# Patient Record
Sex: Male | Born: 1995 | Race: Black or African American | Hispanic: No | Marital: Single | Smoking: Current every day smoker
Health system: Southern US, Community
[De-identification: ages and names within clinical notes are randomized; demographics above are authoritative.]

---

## 2015-06-14 ENCOUNTER — Encounter (HOSPITAL_COMMUNITY): Payer: Self-pay | Admitting: Nurse Practitioner

## 2015-06-14 ENCOUNTER — Emergency Department (HOSPITAL_COMMUNITY): Payer: Self-pay

## 2015-06-14 ENCOUNTER — Emergency Department (HOSPITAL_COMMUNITY)
Admission: EM | Admit: 2015-06-14 | Discharge: 2015-06-14 | Disposition: A | Discharge status: Home / Self Care | Payer: Self-pay | Attending: Emergency Medicine | Admitting: Emergency Medicine

## 2015-06-14 DIAGNOSIS — F17210 Nicotine dependence, cigarettes, uncomplicated: Secondary | ICD-10-CM | POA: Insufficient documentation

## 2015-06-14 DIAGNOSIS — Y9289 Other specified places as the place of occurrence of the external cause: Secondary | ICD-10-CM | POA: Insufficient documentation

## 2015-06-14 DIAGNOSIS — Y998 Other external cause status: Secondary | ICD-10-CM | POA: Insufficient documentation

## 2015-06-14 DIAGNOSIS — W2209XA Striking against other stationary object, initial encounter: Secondary | ICD-10-CM | POA: Insufficient documentation

## 2015-06-14 DIAGNOSIS — Y9389 Activity, other specified: Secondary | ICD-10-CM | POA: Insufficient documentation

## 2015-06-14 DIAGNOSIS — S6991XA Unspecified injury of right wrist, hand and finger(s), initial encounter: Secondary | ICD-10-CM | POA: Insufficient documentation

## 2015-06-14 MED ORDER — IBUPROFEN 600 MG PO TABS: 600.0000 mg | ORAL_TABLET | Freq: Four times a day (QID) | ORAL | Status: AC | PRN

## 2015-06-14 MED ORDER — IBUPROFEN 400 MG PO TABS
800.0000 mg | ORAL_TABLET | Freq: Once | ORAL | Status: AC
  Administered 2015-06-14: 800 mg via ORAL
  Filled 2015-06-14: qty 2

## 2015-06-14 NOTE — ED Provider Notes (Signed)
CSN: [id number]     Arrival date & time 06/14/15  1327 History  By signing my name below, I, Soijett Blue, attest that this documentation has been prepared under the direction and in the presence of S. Lane Hacker, PA-C Electronically Signed: Soijett Blue, ED Scribe. 06/14/2015. 2:49 PM.    Chief Complaint  Patient presents with  . Hand Injury      The history is provided by the patient. No language interpreter was used.    Gabriel Barrett is a 20 y.o. male who presents to the Emergency Department complaining of right hand injury occurring last night. Pt states that he struck a wall due to being upset. Pt is having associated symptoms of right hand swelling and tingling to right knuckles. He notes that he has not tried any medications for the relief of his symptoms. He denies numbness and any other symptoms.    History reviewed. No pertinent past medical history. History reviewed. No pertinent past surgical history. History reviewed. No pertinent family history. Social History  Substance Use Topics  . Smoking status: Current Every Day Smoker    Types: Cigarettes  . Smokeless tobacco: None  . Alcohol Use: Yes    Review of Systems  A complete 10 system review of systems was obtained and all systems are negative except as noted in the HPI and PMH.   Allergies  Review of patient's allergies indicates no known allergies.  Home Medications   Prior to Admission medications   Not on File   BP 120/81 mmHg  Pulse 75  Temp(Src) 98.5 F (36.9 C) (Oral)  Resp 17  SpO2 99% Physical Exam  Constitutional: He is oriented to person, place, and time. He appears well-developed and well-nourished. No distress.  HENT:  Head: Normocephalic and atraumatic.  Eyes: EOM are normal.  Neck: Neck supple.  Cardiovascular: Normal rate.   Pulmonary/Chest: Effort normal. No respiratory distress.  Abdominal: He exhibits no distension.  Musculoskeletal: Normal range of motion.       Right  hand: He exhibits swelling. He exhibits normal range of motion. Normal sensation noted. Normal strength noted.  Edematous right hand. TTP at right fourth and fifth digit. NVI. FROM.  Neurological: He is alert and oriented to person, place, and time.  Skin: Skin is warm and dry.  Psychiatric: He has a normal mood and affect. His behavior is normal.  Nursing note and vitals reviewed.   ED Course  Procedures  DIAGNOSTIC STUDIES: Oxygen Saturation is 99% on RA, nl by my interpretation.    COORDINATION OF CARE: 2:48 PM Discussed treatment plan with pt at bedside which includes anti-inflammatory Rx, follow up with PCP in 1 week and pt agreed to plan.  Imaging Review Dg Hand Complete Right  06/14/2015  CLINICAL DATA:  Pain and swelling at right fourth and fifth digits on posterior side of metacarpals after punching a brick wall yesterday. EXAM: RIGHT HAND - COMPLETE 3+ VIEW COMPARISON:  None. FINDINGS: Osseous alignment is normal. Bone mineralization is normal. No fracture line or displaced fracture fragment seen. Soft tissues about the right hand are unremarkable. IMPRESSION: Negative. Electronically Signed   By: Bary Richard M.D.   On: 06/14/2015 14:10   I have personally reviewed and evaluated these images as part of my medical decision-making.  MDM   Final diagnoses:  Hand injury, right, initial encounter   Patient X-Ray negative for obvious fracture or dislocation.  Pt advised to follow up with PCP in 1 week PRN. Patient given  ace wrap while in ED, conservative therapy recommended and discussed. Patient will be discharged home & is agreeable with above plan. Returns precautions discussed. Pt appears safe for discharge.  I personally performed the services described in this documentation, which was scribed in my presence. The recorded information has been reviewed and is accurate.    Melton KrebsSamantha Nicole Chalene Treu, PA-C 06/15/15 1718  Lorre NickAnthony Allen, MD 06/20/15 1400

## 2015-06-14 NOTE — Discharge Instructions (Signed)
Mr. Gabriel MasonJeremiah Bushee,  Nice meeting you! Please follow-up with your primary care provider. Return to the emergency department if you develop increased pain, numbness/tingling, inability to move your hand, new/worsening symptoms. Feel better soon!  S. Lane HackerNicole Eleanor Dimichele, PA-C

## 2015-06-14 NOTE — ED Notes (Signed)
He c/o 1 day history R 4th and 5th digits/hand pain since punching a wall. Cms intact.

## 2015-06-14 NOTE — ED Notes (Signed)
Pt struck a wall with right fist last pm. Swelling and discoloration noted.

## 2017-09-13 IMAGING — DX DG HAND COMPLETE 3+V*R*
3 series · 3 of 3 positions shown · non-contrast
Comparison: None.

CLINICAL DATA: Pain and swelling at right fourth and fifth digits
on posterior side of metacarpals after punching a brick wall
yesterday.

EXAM:
RIGHT HAND - COMPLETE 3+ VIEW

[x hand pa right]
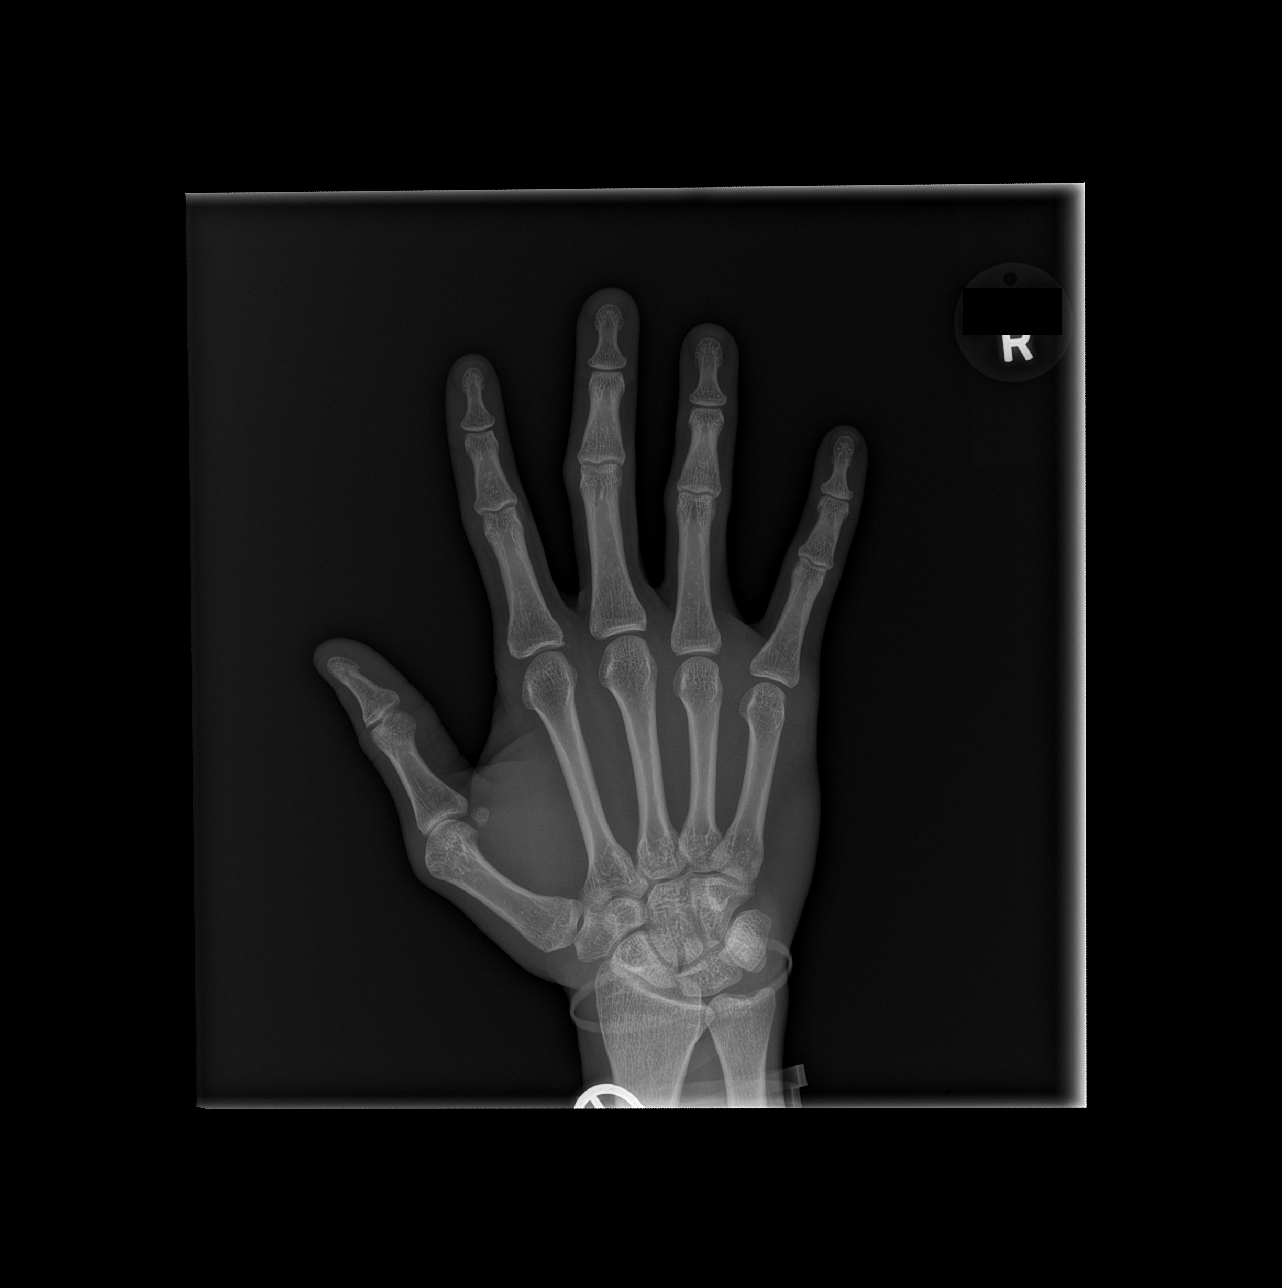

[x hand obl right]
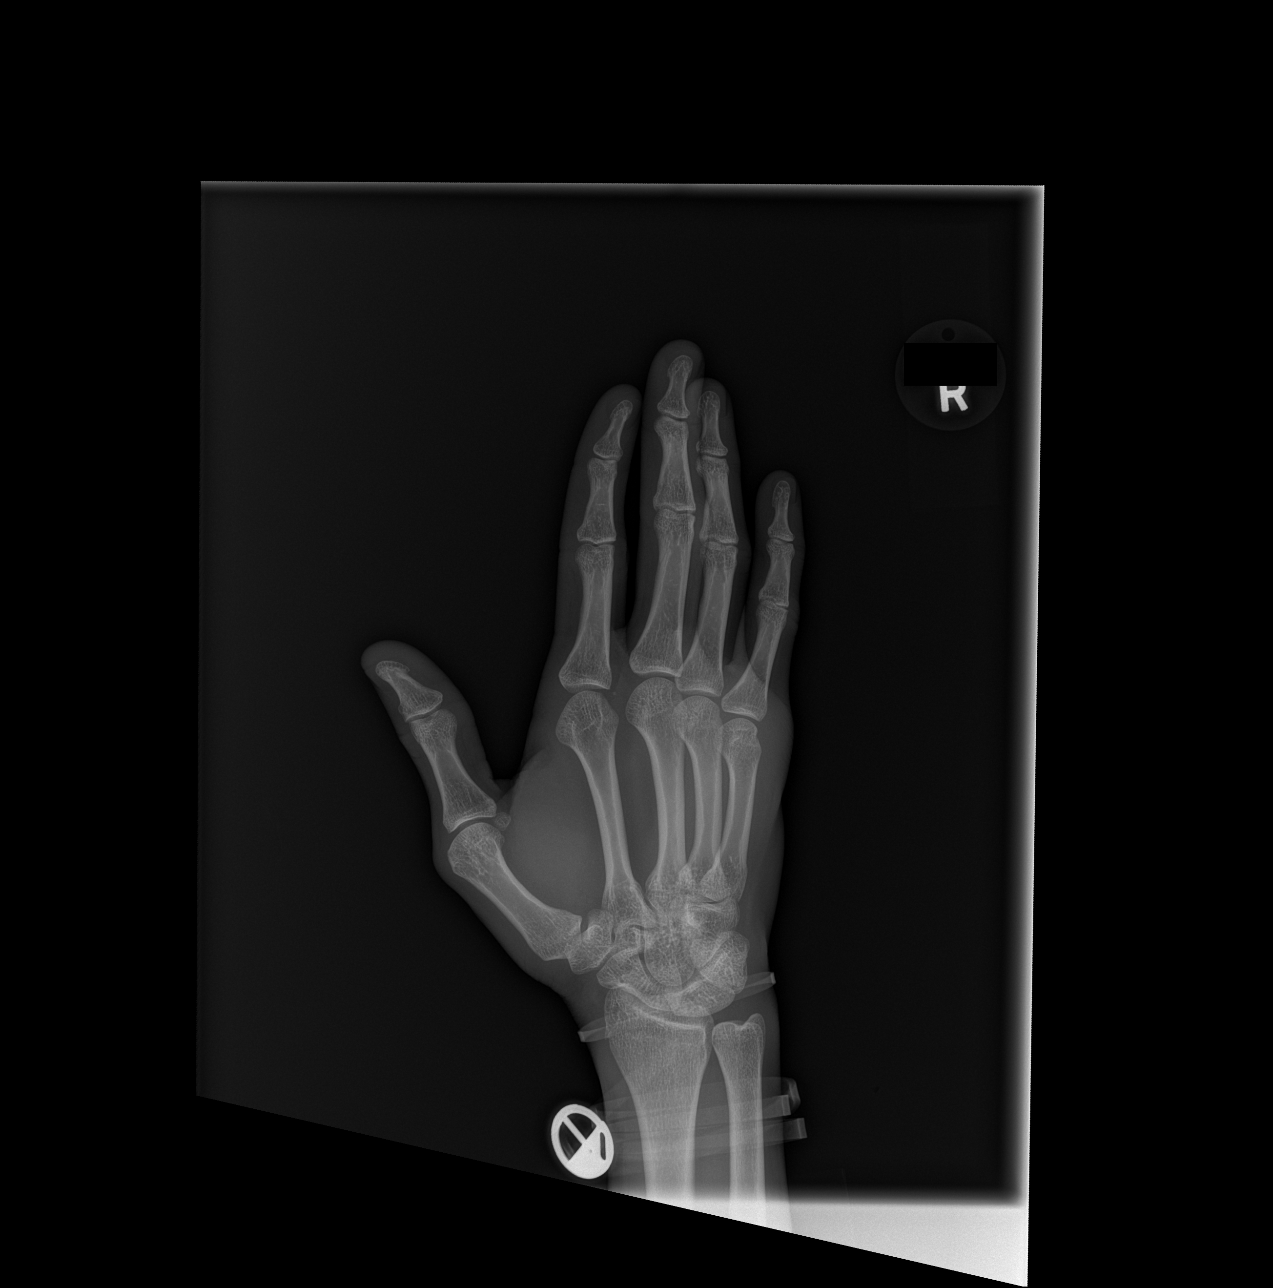

[x hand lat right]
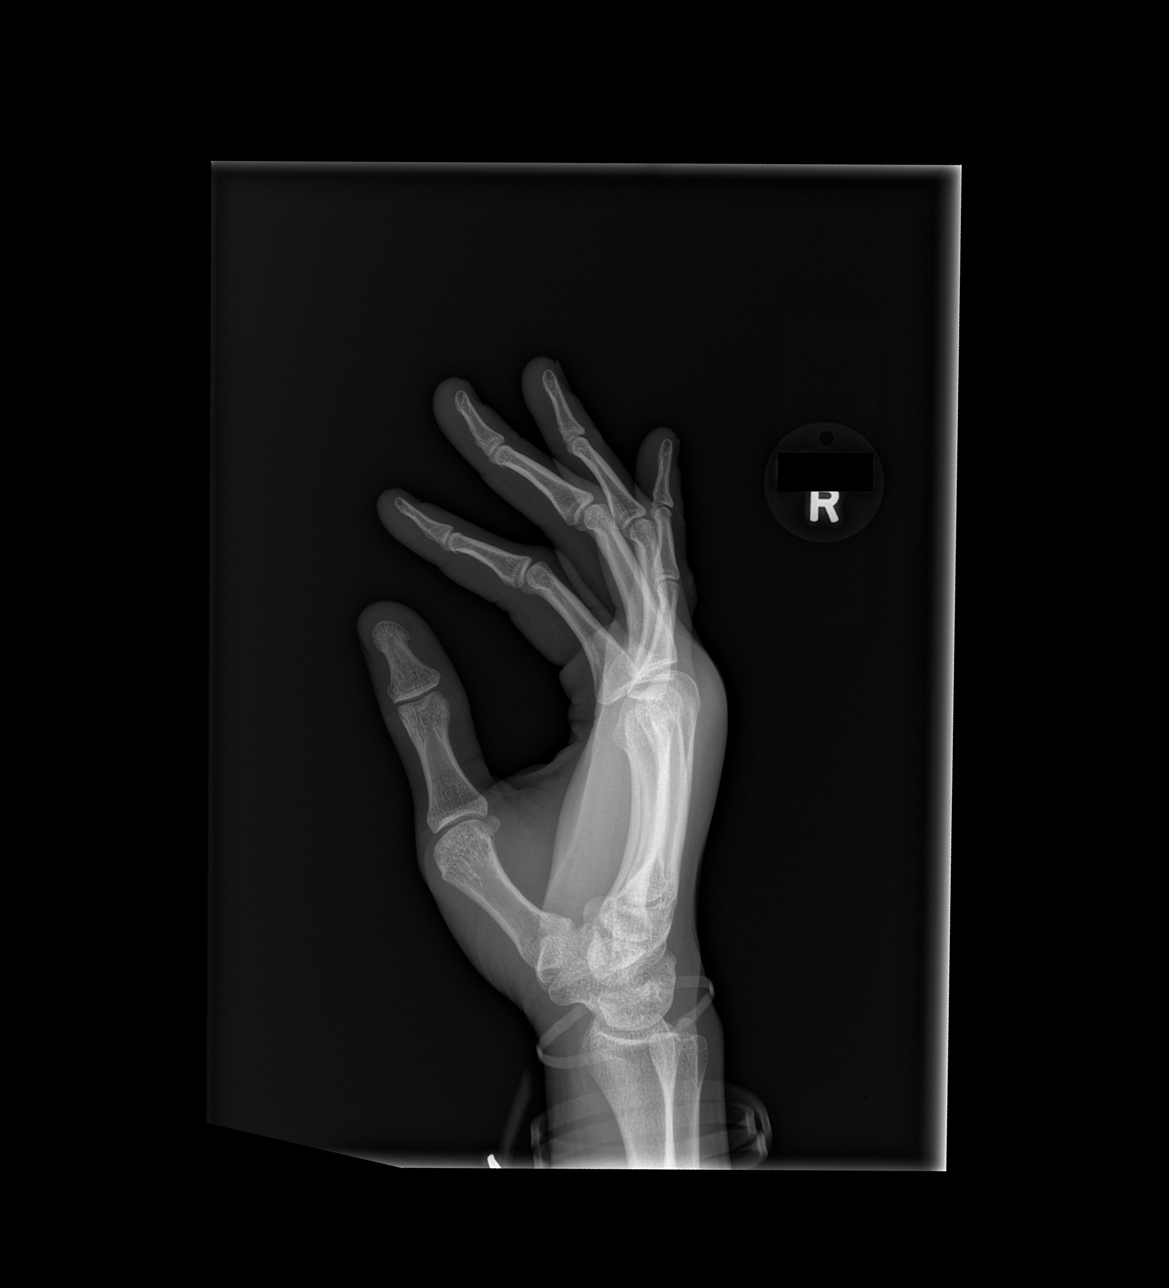

[3 of 3 positions shown; findings below may reference images not displayed]

FINDINGS: Osseous alignment is normal. Bone mineralization is normal. No
fracture line or displaced fracture fragment seen. Soft tissues
about the right hand are unremarkable.
IMPRESSION: Negative.
